# Patient Record
Sex: Male | Born: 1970 | Race: White | Hispanic: No | Smoking: Current every day smoker
Health system: Southern US, Community
[De-identification: ages and names within clinical notes are randomized; demographics above are authoritative.]

## PROBLEM LIST (undated history)

## (undated) DIAGNOSIS — I10 Essential (primary) hypertension: Secondary | ICD-10-CM

## (undated) DIAGNOSIS — F419 Anxiety disorder, unspecified: Secondary | ICD-10-CM

## (undated) HISTORY — PX: BACK SURGERY: SHX140

---

## 2005-10-23 ENCOUNTER — Inpatient Hospital Stay (HOSPITAL_COMMUNITY): Admission: EM | Admit: 2005-10-23 | Discharge: 2005-10-28 | Payer: Self-pay | Admitting: Emergency Medicine

## 2007-08-28 IMAGING — CR DG CHEST 1V PORT
1 series · 1 of 1 positions shown · non-contrast
Comparison: 10/23/05.

CLINICAL DATA: Fracture of the medial malleolus.
 PORTABLE CHEST - 1 VIEW (3797 hours):

[view not recorded]
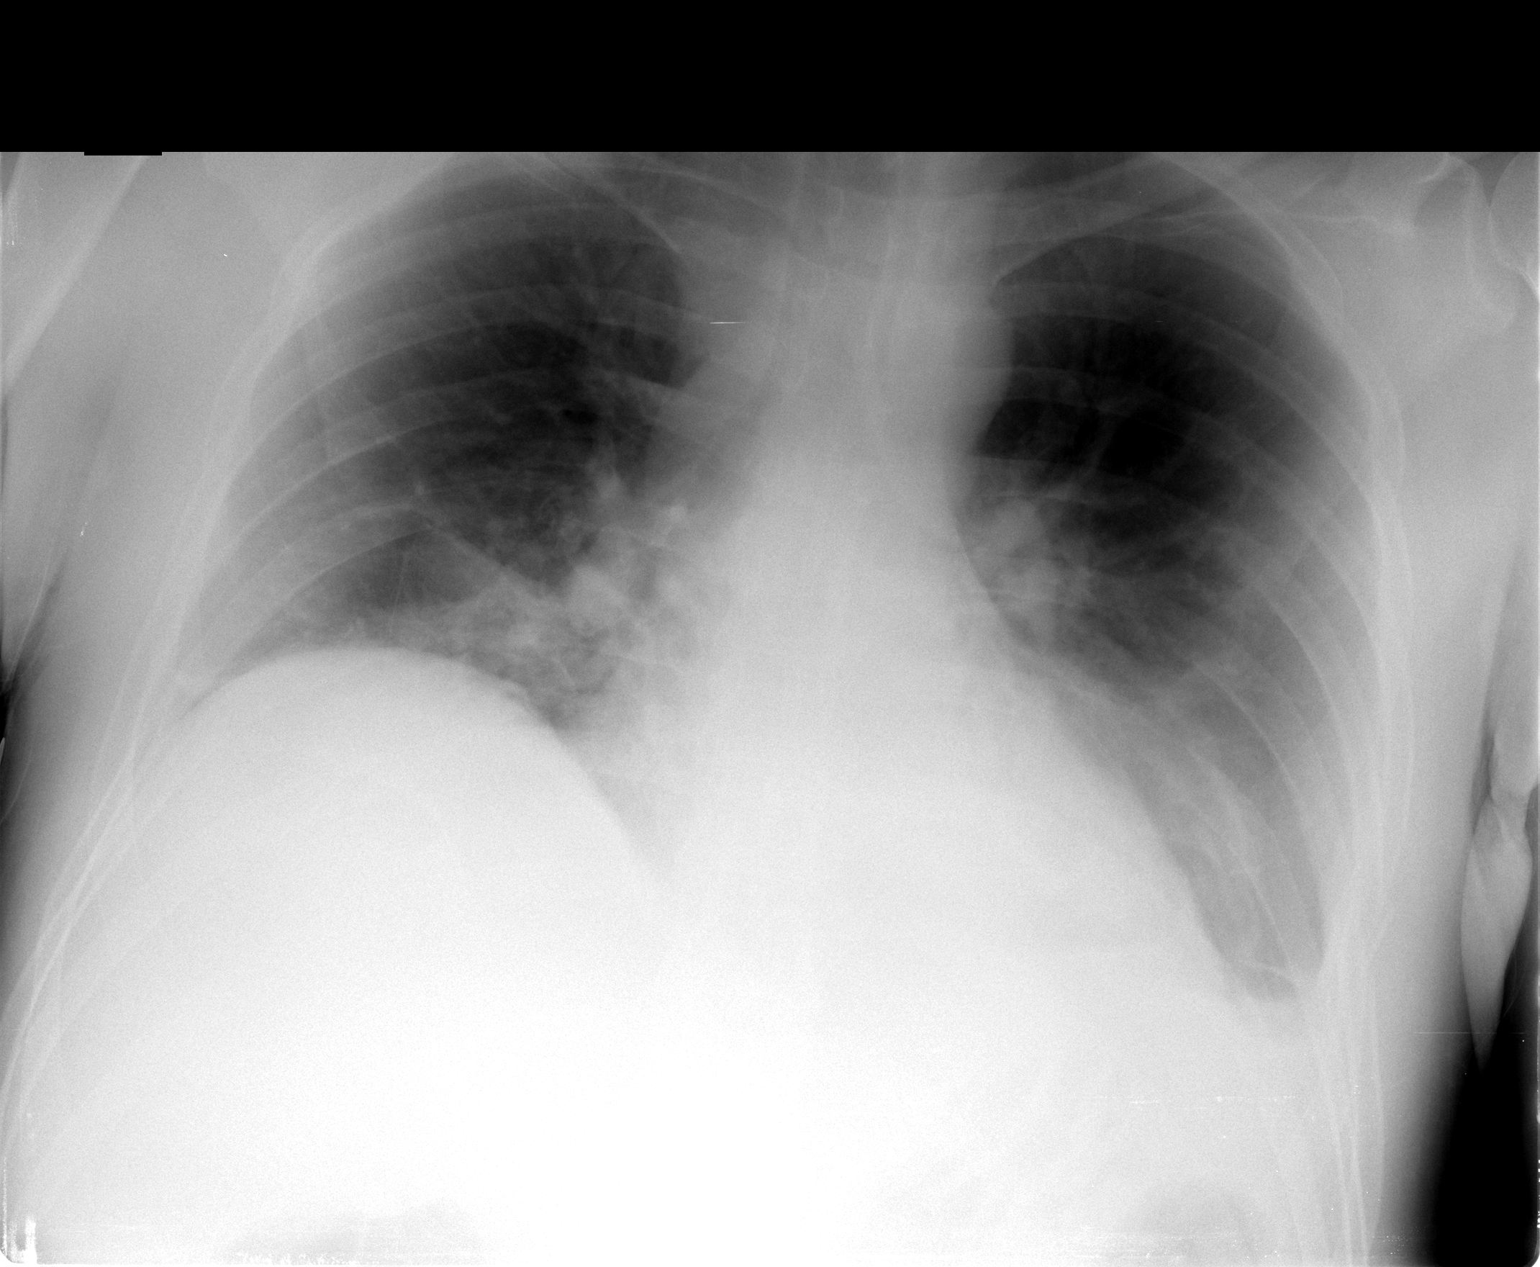

[1 of 1 positions shown; findings below may reference images not displayed]

FINDINGS: There has been an increase in basilar atelectasis bilaterally with possible small left effusion. There do appear to be left rib fractures present. No definite pneumothorax is seen.
IMPRESSION: Increase in opacities at the lung bases consistent with atelectasis and possible left effusion. Left rib fractures are noted.

## 2007-08-29 IMAGING — RF DG ANKLE COMPLETE 3+V*R*
1 series · 4 of 4 positions shown · non-contrast
Comparison: none

CLINICAL DATA: Fracture medial malleolus. 
RIGHT ANKLE -3 VIEW:

[Series 1: run · 4 of 4 slices shown]
[im 1/4]
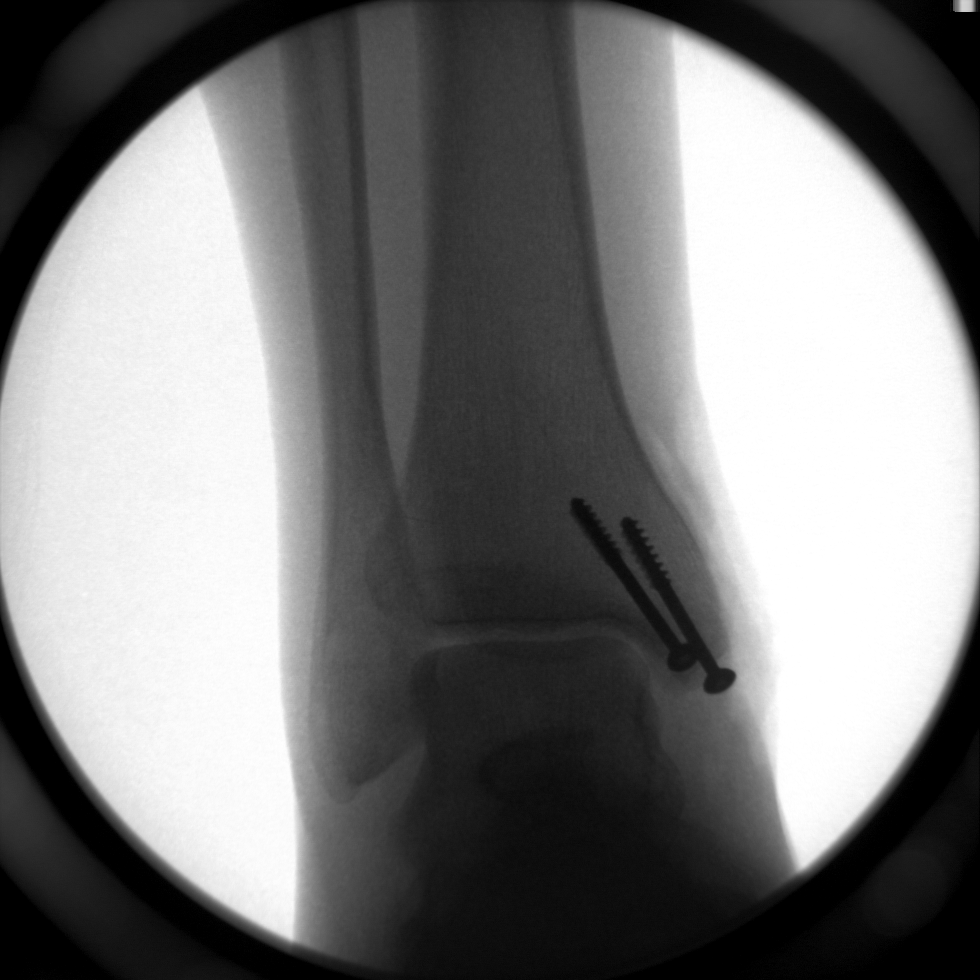
[im 2/4]
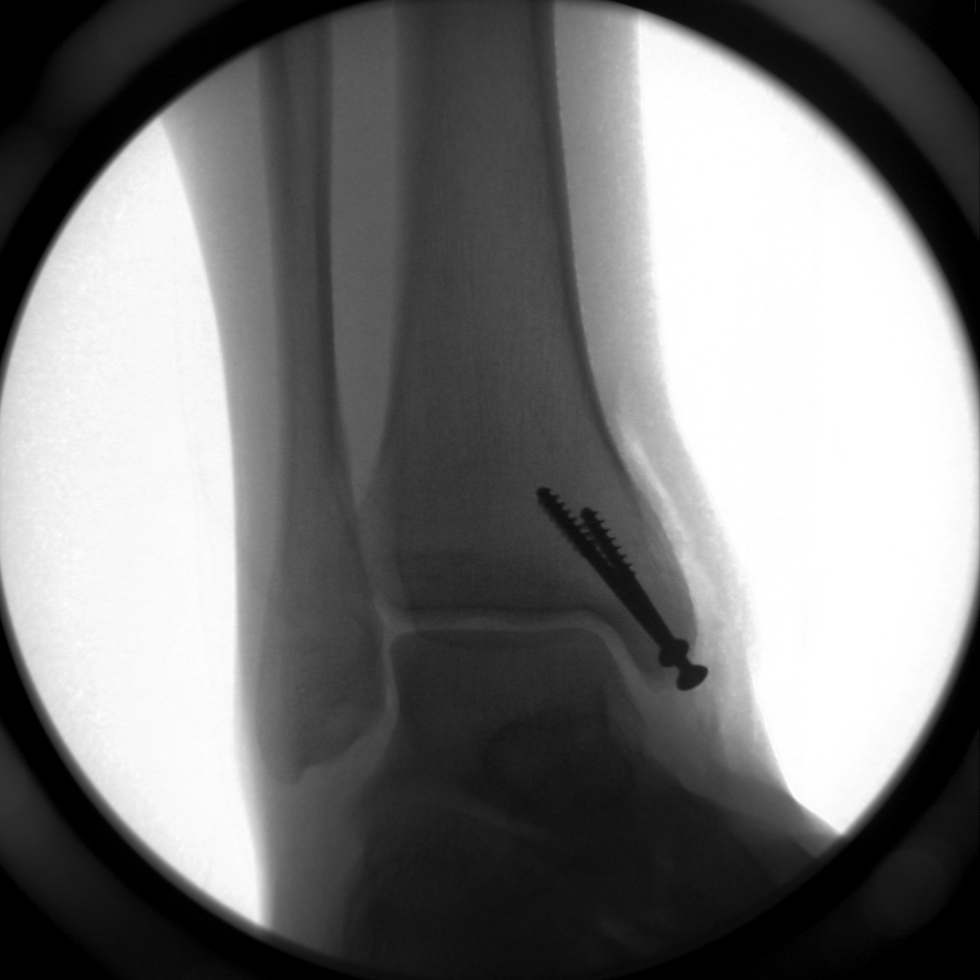
[im 3/4]
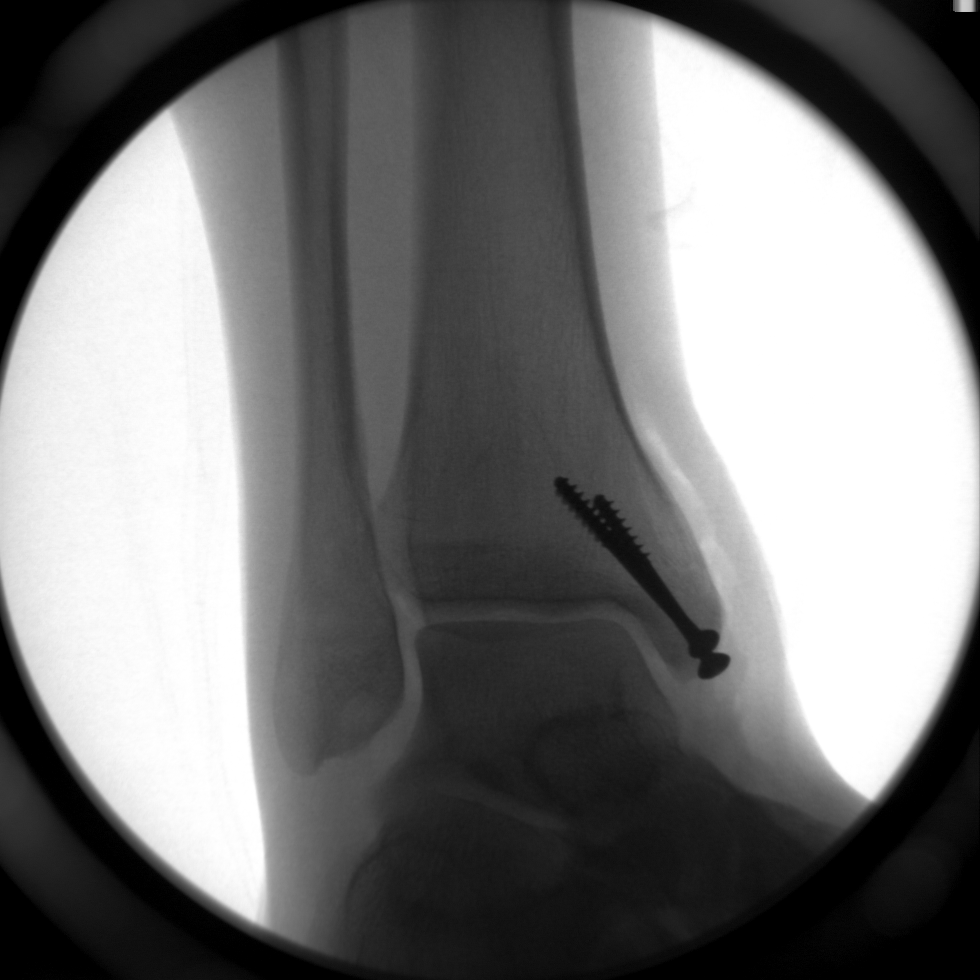
[im 4/4]
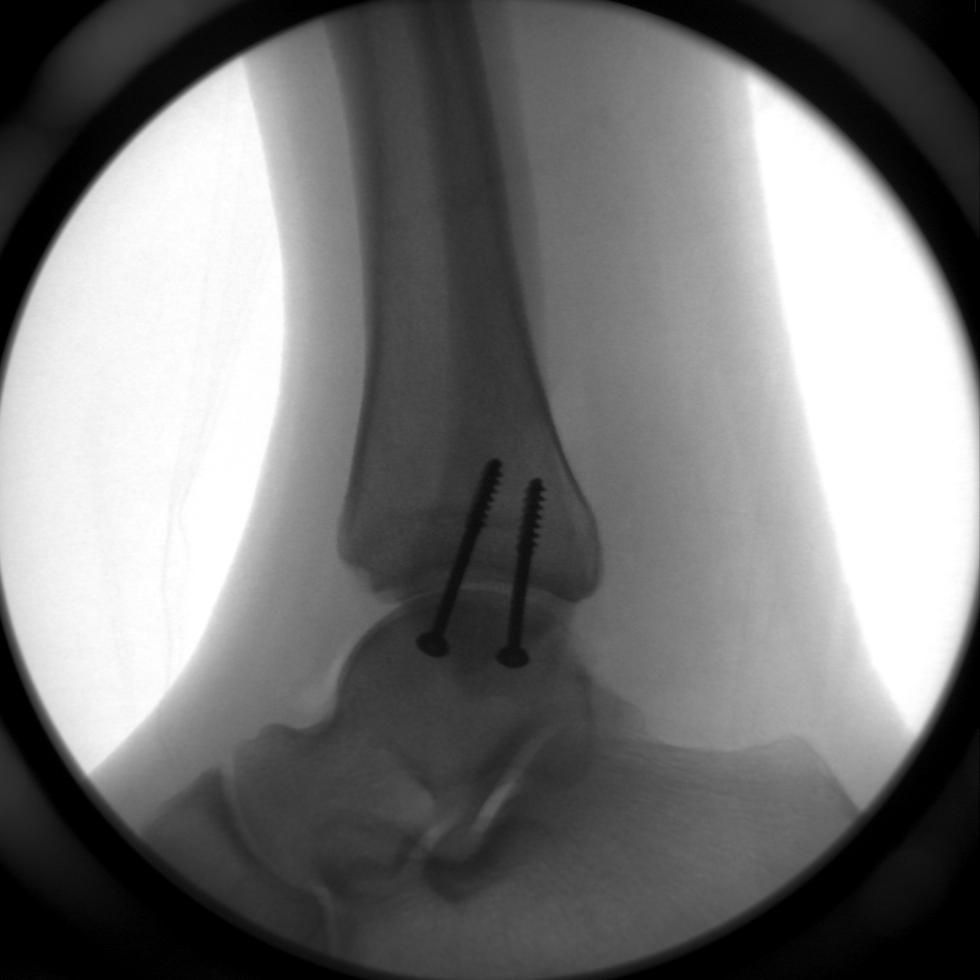

[4 of 4 positions shown; findings below may reference images not displayed]

FINDINGS: Two screws transfix medial malleolar fracture.
IMPRESSION: Open reduction and internal fixation medial malleolar fracture.
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
## 2013-12-29 ENCOUNTER — Emergency Department (HOSPITAL_COMMUNITY): Payer: Self-pay

## 2013-12-29 ENCOUNTER — Encounter (HOSPITAL_COMMUNITY): Payer: Self-pay | Admitting: Emergency Medicine

## 2013-12-29 ENCOUNTER — Emergency Department (HOSPITAL_COMMUNITY)
Admission: EM | Admit: 2013-12-29 | Discharge: 2013-12-29 | Disposition: A | Discharge status: Home / Self Care | Payer: Self-pay | Attending: Emergency Medicine | Admitting: Emergency Medicine

## 2013-12-29 DIAGNOSIS — Y998 Other external cause status: Secondary | ICD-10-CM | POA: Insufficient documentation

## 2013-12-29 DIAGNOSIS — I10 Essential (primary) hypertension: Secondary | ICD-10-CM | POA: Insufficient documentation

## 2013-12-29 DIAGNOSIS — Y9289 Other specified places as the place of occurrence of the external cause: Secondary | ICD-10-CM | POA: Insufficient documentation

## 2013-12-29 DIAGNOSIS — Y9389 Activity, other specified: Secondary | ICD-10-CM | POA: Insufficient documentation

## 2013-12-29 DIAGNOSIS — Z720 Tobacco use: Secondary | ICD-10-CM | POA: Insufficient documentation

## 2013-12-29 DIAGNOSIS — S0990XA Unspecified injury of head, initial encounter: Secondary | ICD-10-CM

## 2013-12-29 DIAGNOSIS — Z8659 Personal history of other mental and behavioral disorders: Secondary | ICD-10-CM | POA: Insufficient documentation

## 2013-12-29 DIAGNOSIS — S0181XA Laceration without foreign body of other part of head, initial encounter: Secondary | ICD-10-CM | POA: Insufficient documentation

## 2013-12-29 HISTORY — DX: Anxiety disorder, unspecified: F41.9

## 2013-12-29 HISTORY — DX: Essential (primary) hypertension: I10

## 2013-12-29 MED ORDER — VITAMIN B-1 100 MG PO TABS
100.0000 mg | ORAL_TABLET | Freq: Every day | ORAL | Status: DC
  Administered 2013-12-29: 100 mg via ORAL
  Filled 2013-12-29: qty 1

## 2013-12-29 MED ORDER — THIAMINE HCL 100 MG/ML IJ SOLN
100.0000 mg | Freq: Every day | INTRAMUSCULAR | Status: DC
  Filled 2013-12-29: qty 2

## 2013-12-29 MED ORDER — LORAZEPAM 1 MG PO TABS: 1.0000 mg | ORAL_TABLET | Freq: Once | ORAL | Status: DC

## 2013-12-29 MED ORDER — LORAZEPAM 1 MG PO TABS
0.0000 mg | ORAL_TABLET | Freq: Two times a day (BID) | ORAL | Status: DC
  Filled 2013-12-29: qty 1

## 2013-12-29 MED ORDER — ACETAMINOPHEN 325 MG PO TABS
650.0000 mg | ORAL_TABLET | Freq: Once | ORAL | Status: AC
  Administered 2013-12-29: 650 mg via ORAL
  Filled 2013-12-29: qty 2

## 2013-12-29 MED ORDER — LORAZEPAM 2 MG/ML IJ SOLN
0.0000 mg | Freq: Two times a day (BID) | INTRAMUSCULAR | Status: DC
Start: 2013-12-29 — End: 2013-12-30

## 2013-12-29 MED ORDER — LORAZEPAM 1 MG PO TABS
0.0000 mg | ORAL_TABLET | Freq: Four times a day (QID) | ORAL | Status: DC
Start: 2013-12-29 — End: 2013-12-30
  Administered 2013-12-29: 1 mg via ORAL

## 2013-12-29 MED ORDER — LIDOCAINE-EPINEPHRINE (PF) 2 %-1:200000 IJ SOLN
20.0000 mL | Freq: Once | INTRAMUSCULAR | Status: AC
  Administered 2013-12-29: 20 mL
  Filled 2013-12-29: qty 20
  Filled 2013-12-29: qty 40

## 2013-12-29 MED ORDER — LORAZEPAM 2 MG/ML IJ SOLN
0.0000 mg | Freq: Four times a day (QID) | INTRAMUSCULAR | Status: DC
Start: 2013-12-29 — End: 2013-12-30
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
## 2013-12-29 NOTE — Discharge Instructions (Signed)
Read the information below.  You may return to the Emergency Department at any time for worsening condition or any new symptoms that concern you.  If you develop redness, swelling, pus draining from the wound, uncontrolled bleeding, or fevers greater than 100.4, return to the ER immediately for a recheck.      You have had a head injury which does not appear to require admission at this time. A concussion is a state of changed mental ability from trauma. SEEK IMMEDIATE MEDICAL ATTENTION IF: There is confusion or drowsiness (although children frequently become drowsy after injury).  You cannot awaken the injured person.  There is nausea (feeling sick to your stomach) or continued, forceful vomiting.  You notice dizziness or unsteadiness which is getting worse, or inability to walk.  You have convulsions or unconsciousness.  You experience severe, persistent headaches not relieved by Tylenol?. (Do not take aspirin as this impairs clotting abilities). Take other pain medications only as directed.  You cannot use arms or legs normally.  There are changes in pupil sizes. (This is the black center in the colored part of the eye)  There is clear or bloody discharge from the nose or ears.  Change in speech, vision, swallowing, or understanding.  Localized weakness, numbness, tingling, or change in bowel or bladder control.    Assault, General Assault includes any behavior, whether intentional or reckless, which results in bodily injury to another person and/or damage to property. Included in this would be any behavior, intentional or reckless, that by its nature would be understood (interpreted) by a reasonable person as intent to harm another person or to damage his/her property. Threats may be oral or written. They may be communicated through regular mail, computer, fax, or phone. These threats may be direct or implied. FORMS OF ASSAULT INCLUDE:  Physically assaulting a person. This includes  physical threats to inflict physical harm as well as:  Slapping.  Hitting.  Poking.  Kicking.  Punching.  Pushing.  Arson.  Sabotage.  Equipment vandalism.  Damaging or destroying property.  Throwing or hitting objects.  Displaying a weapon or an object that appears to be a weapon in a threatening manner.  Carrying a firearm of any kind.  Using a weapon to harm someone.  Using greater physical size/strength to intimidate another.  Making intimidating or threatening gestures.  Bullying.  Hazing.  Intimidating, threatening, hostile, or abusive language directed toward another person.  It communicates the intention to engage in violence against that person. And it leads a reasonable person to expect that violent behavior may occur.  Stalking another person. IF IT HAPPENS AGAIN:  Immediately call for emergency help (911 in [place]  If someone poses clear and immediate danger to you, seek legal authorities to have a protective or restraining order put in place.  Less threatening assaults can at least be reported to authorities. STEPS TO TAKE IF A SEXUAL ASSAULT HAS HAPPENED  Go to an area of safety. This may include a shelter or staying with a friend. Stay away from the area where you have been attacked. A large percentage of sexual assaults are caused by a friend, relative or associate.  If medications were given by your caregiver, take them as directed for the full length of time prescribed.  Only take over-the-counter or prescription medicines for pain, discomfort, or fever as directed by your caregiver.  If you have come in contact with a sexual disease, find out if you are to be tested again. If  your caregiver is concerned about the HIV/AIDS virus, he/she may require you to have continued testing for several months.  For the protection of your privacy, test results can not be given over the phone. Make sure you receive the results of your test. If your test  results are not back during your visit, make an appointment with your caregiver to find out the results. Do not assume everything is normal if you have not heard from your caregiver or the medical facility. It is important for you to follow up on all of your test results.  File appropriate papers with authorities. This is important in all assaults, even if it has occurred in a family or by a friend. SEEK MEDICAL CARE IF:  You have new problems because of your injuries.  You have problems that may be because of the medicine you are taking, such as:  Rash.  Itching.  Swelling.  Trouble breathing.  You develop belly (abdominal) pain, feel sick to your stomach (nausea) or are vomiting.  You begin to run a temperature.  You need supportive care or referral to a rape crisis center. These are centers with trained personnel who can help you get through this ordeal. SEEK IMMEDIATE MEDICAL CARE IF:  You are afraid of being threatened, beaten, or abused. In [place] call 911.  You receive new injuries related to abuse.  You develop severe pain in any area injured in the assault or have any change in your condition that concerns you.  You faint or lose consciousness.  You develop chest pain or shortness of breath. Document Released: 12/25/2004 Document Revised: 03/19/2011 Document Reviewed: 08/13/2007 [facility] Patient Information 2015 [employer] [place]. This information is not intended to replace advice given to you by your health care provider. Make sure you discuss any questions you have with your health care provider.  Facial Laceration  A facial laceration is a cut on the face. These injuries can be painful and cause bleeding. Lacerations usually heal quickly, but they need special care to reduce scarring. DIAGNOSIS  Your health care provider will take a medical history, ask for details about how the injury occurred, and examine the wound to determine how deep the cut is. TREATMENT  Some  facial lacerations may not require closure. Others may not be able to be closed because of an increased risk of infection. The risk of infection and the chance for successful closure will depend on various factors, including the amount of time since the injury occurred. The wound may be cleaned to help prevent infection. If closure is appropriate, pain medicines may be given if needed. Your health care provider will use stitches (sutures), wound glue (adhesive), or skin adhesive strips to repair the laceration. These tools bring the skin edges together to allow for faster healing and a better cosmetic outcome. If needed, you may also be given a tetanus shot. HOME CARE INSTRUCTIONS  Only take over-the-counter or prescription medicines as directed by your health care provider.  Follow your health care provider's instructions for wound care. These instructions will vary depending on the technique used for closing the wound. For Sutures:  Keep the wound clean and dry.   If you were given a bandage (dressing), you should change it at least once a day. Also change the dressing if it becomes wet or dirty, or as directed by your health care provider.   Wash the wound with soap and water 2 times a day. Rinse the wound off with water to remove  all soap. Pat the wound dry with a clean towel.   After cleaning, apply a thin layer of the antibiotic ointment recommended by your health care provider. This will help prevent infection and keep the dressing from sticking.   You may shower as usual after the first 24 hours. Do not soak the wound in water until the sutures are removed.   Get your sutures removed as directed by your health care provider. With facial lacerations, sutures should usually be taken out after 4-5 days to avoid stitch marks.   Wait a few days after your sutures are removed before applying any makeup. For Skin Adhesive Strips:  Keep the wound clean and dry.   Do not get the skin  adhesive strips wet. You may bathe carefully, using caution to keep the wound dry.   If the wound gets wet, pat it dry with a clean towel.   Skin adhesive strips will fall off on their own. You may trim the strips as the wound heals. Do not remove skin adhesive strips that are still stuck to the wound. They will fall off in time.  For Wound Adhesive:  You may briefly wet your wound in the shower or bath. Do not soak or scrub the wound. Do not swim. Avoid periods of heavy sweating until the skin adhesive has fallen off on its own. After showering or bathing, gently pat the wound dry with a clean towel.   Do not apply liquid medicine, cream medicine, ointment medicine, or makeup to your wound while the skin adhesive is in place. This may loosen the film before your wound is healed.   If a dressing is placed over the wound, be careful not to apply tape directly over the skin adhesive. This may cause the adhesive to be pulled off before the wound is healed.   Avoid prolonged exposure to sunlight or tanning lamps while the skin adhesive is in place.  The skin adhesive will usually remain in place for 5-10 days, then naturally fall off the skin. Do not pick at the adhesive film.  After Healing: Once the wound has healed, cover the wound with sunscreen during the day for 1 full year. This can help minimize scarring. Exposure to ultraviolet light in the first year will darken the scar. It can take 1-2 years for the scar to lose its redness and to heal completely.  SEEK IMMEDIATE MEDICAL CARE IF:  You have redness, pain, or swelling around the wound.   You see ayellowish-white fluid (pus) coming from the wound.   You have chills or a fever.  MAKE SURE YOU:  Understand these instructions.  Will watch your condition.  Will get help right away if you are not doing well or get worse. Document Released: 02/02/2004 Document Revised: 10/15/2012 Document Reviewed: 08/07/2012 [facility]  Patient Information 2015 [place], [place]. This information is not intended to replace advice given to you by your health care provider. Make sure you discuss any questions you have with your health care provider.

## 2013-12-29 NOTE — ED Provider Notes (Signed)
LACERATION REPAIR Date/Time: 12/29/2013 11:37 PM Performed by: Elwin MochaWALDEN, Ramesha Poster Authorized by: Elwin MochaWALDEN, Sisto Granillo Consent: The procedure was performed in an emergent situation. Body area: head/neck Location details: forehead Laceration length: 6 cm Foreign bodies: no foreign bodies Tendon involvement: none Nerve involvement: none Vascular damage: yes Anesthesia: local infiltration Local anesthetic: lidocaine 1% with epinephrine Anesthetic total: 5 ml Patient sedated: no Irrigation solution: saline Amount of cleaning: standard Debridement: none Degree of undermining: none Subcutaneous closure: 3-0 Chromic gut Number of sutures: 5 Technique: complex Approximation: close Approximation difficulty: complex Comments: 5 sutures placed to help with arterial bleeding and to approximate deep tissue.     Elwin MochaBlair Shelvy Heckert, MD 12/29/13 [phone]
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
